# Patient Record
Sex: Male | Born: 1970 | Race: White | Hispanic: No | Smoking: Never smoker
Health system: Southern US, Community
[De-identification: ages and names within clinical notes are randomized; demographics above are authoritative.]

## PROBLEM LIST (undated history)

## (undated) DIAGNOSIS — I10 Essential (primary) hypertension: Secondary | ICD-10-CM

## (undated) DIAGNOSIS — E119 Type 2 diabetes mellitus without complications: Secondary | ICD-10-CM

## (undated) HISTORY — PX: BACK SURGERY: SHX140

## (undated) HISTORY — PX: HIP SURGERY: SHX245

---

## 1999-11-02 ENCOUNTER — Encounter: Payer: Self-pay | Admitting: Neurosurgery

## 1999-11-02 ENCOUNTER — Ambulatory Visit (HOSPITAL_COMMUNITY): Admission: RE | Admit: 1999-11-02 | Discharge: 1999-11-02 | Payer: Self-pay | Admitting: Neurosurgery

## 1999-11-12 ENCOUNTER — Encounter: Payer: Self-pay | Admitting: Neurosurgery

## 1999-11-12 ENCOUNTER — Ambulatory Visit (HOSPITAL_COMMUNITY): Admission: RE | Admit: 1999-11-12 | Discharge: 1999-11-12 | Payer: Self-pay | Admitting: Neurosurgery

## 1999-11-13 ENCOUNTER — Encounter: Payer: Self-pay | Admitting: Neurosurgery

## 1999-11-13 ENCOUNTER — Ambulatory Visit (HOSPITAL_COMMUNITY): Admission: RE | Admit: 1999-11-13 | Discharge: 1999-11-13 | Payer: Self-pay | Admitting: Neurosurgery

## 2000-12-28 ENCOUNTER — Encounter: Payer: Self-pay | Admitting: Orthopedic Surgery

## 2001-01-02 ENCOUNTER — Ambulatory Visit (HOSPITAL_COMMUNITY): Admission: RE | Admit: 2001-01-02 | Discharge: 2001-01-02 | Payer: Self-pay | Admitting: Orthopedic Surgery

## 2001-01-02 ENCOUNTER — Encounter: Payer: Self-pay | Admitting: Orthopedic Surgery

## 2003-01-31 ENCOUNTER — Ambulatory Visit (HOSPITAL_COMMUNITY): Admission: RE | Admit: 2003-01-31 | Discharge: 2003-01-31 | Payer: Self-pay | Admitting: Orthopedic Surgery

## 2004-01-03 ENCOUNTER — Emergency Department (HOSPITAL_COMMUNITY): Admission: EM | Admit: 2004-01-03 | Discharge: 2004-01-03 | Payer: Self-pay | Admitting: Emergency Medicine

## 2004-10-18 ENCOUNTER — Encounter: Admission: RE | Admit: 2004-10-18 | Discharge: 2004-10-18 | Payer: Self-pay | Admitting: Family Medicine

## 2004-12-06 ENCOUNTER — Emergency Department: Payer: Self-pay | Admitting: Unknown Physician Specialty

## 2005-01-11 ENCOUNTER — Ambulatory Visit (HOSPITAL_COMMUNITY): Admission: RE | Admit: 2005-01-11 | Discharge: 2005-01-12 | Payer: Self-pay | Admitting: Neurosurgery

## 2005-02-27 ENCOUNTER — Emergency Department: Payer: Self-pay | Admitting: Emergency Medicine

## 2005-04-29 ENCOUNTER — Encounter: Admission: RE | Admit: 2005-04-29 | Discharge: 2005-04-29 | Payer: Self-pay | Admitting: Neurosurgery

## 2005-05-08 ENCOUNTER — Emergency Department: Payer: Self-pay | Admitting: Emergency Medicine

## 2006-12-11 IMAGING — CR DG SHOULDER 3+V*L*
1 series · 3 of 3 positions shown · non-contrast
Comparison: none

REASON FOR EXAM: Fall
COMMENTS:  LMP: (Male)

PROCEDURE:     DXR - DXR SHOULDER LEFT COMPLETE  - February 27, 2005 [DATE]
RESULT:          No acute soft tissue or bony abnormality is identified.
There is no evidence of fracture or dislocation.

[Series 1: view not recorded · 0.17mm/px · 3 of 3 slices shown]
[im 1/3]
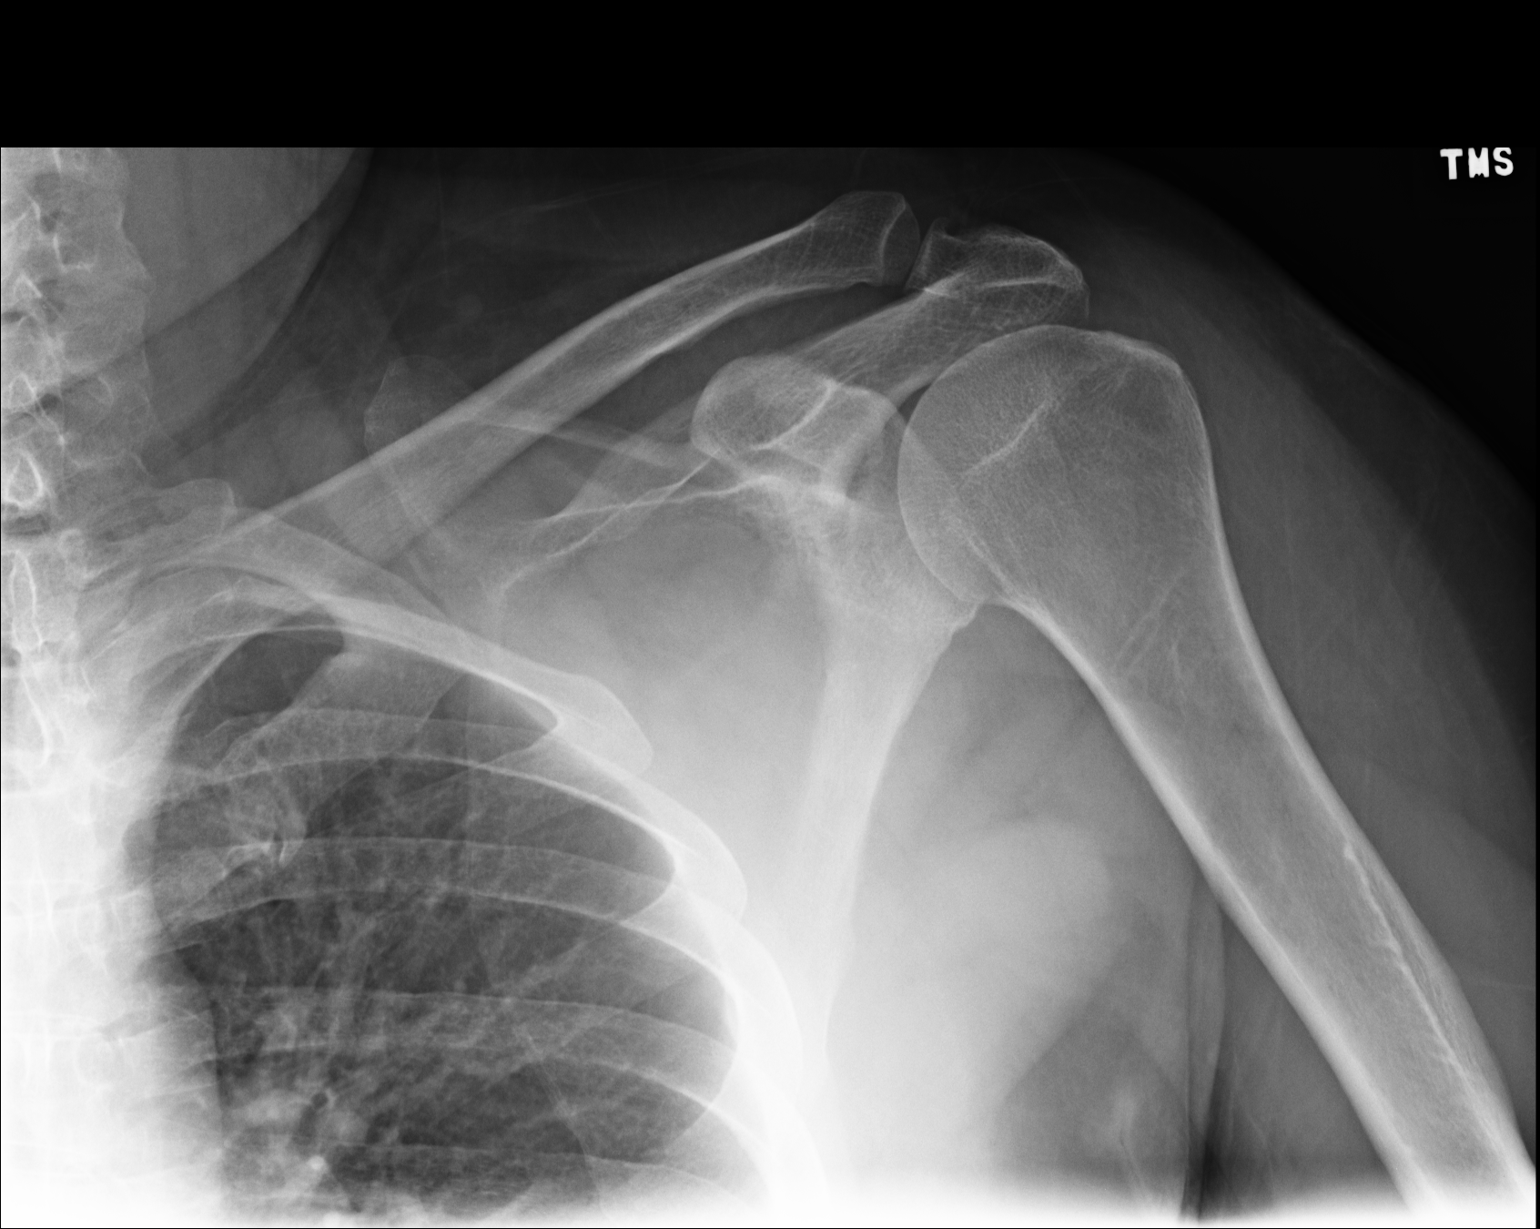
[im 2/3]
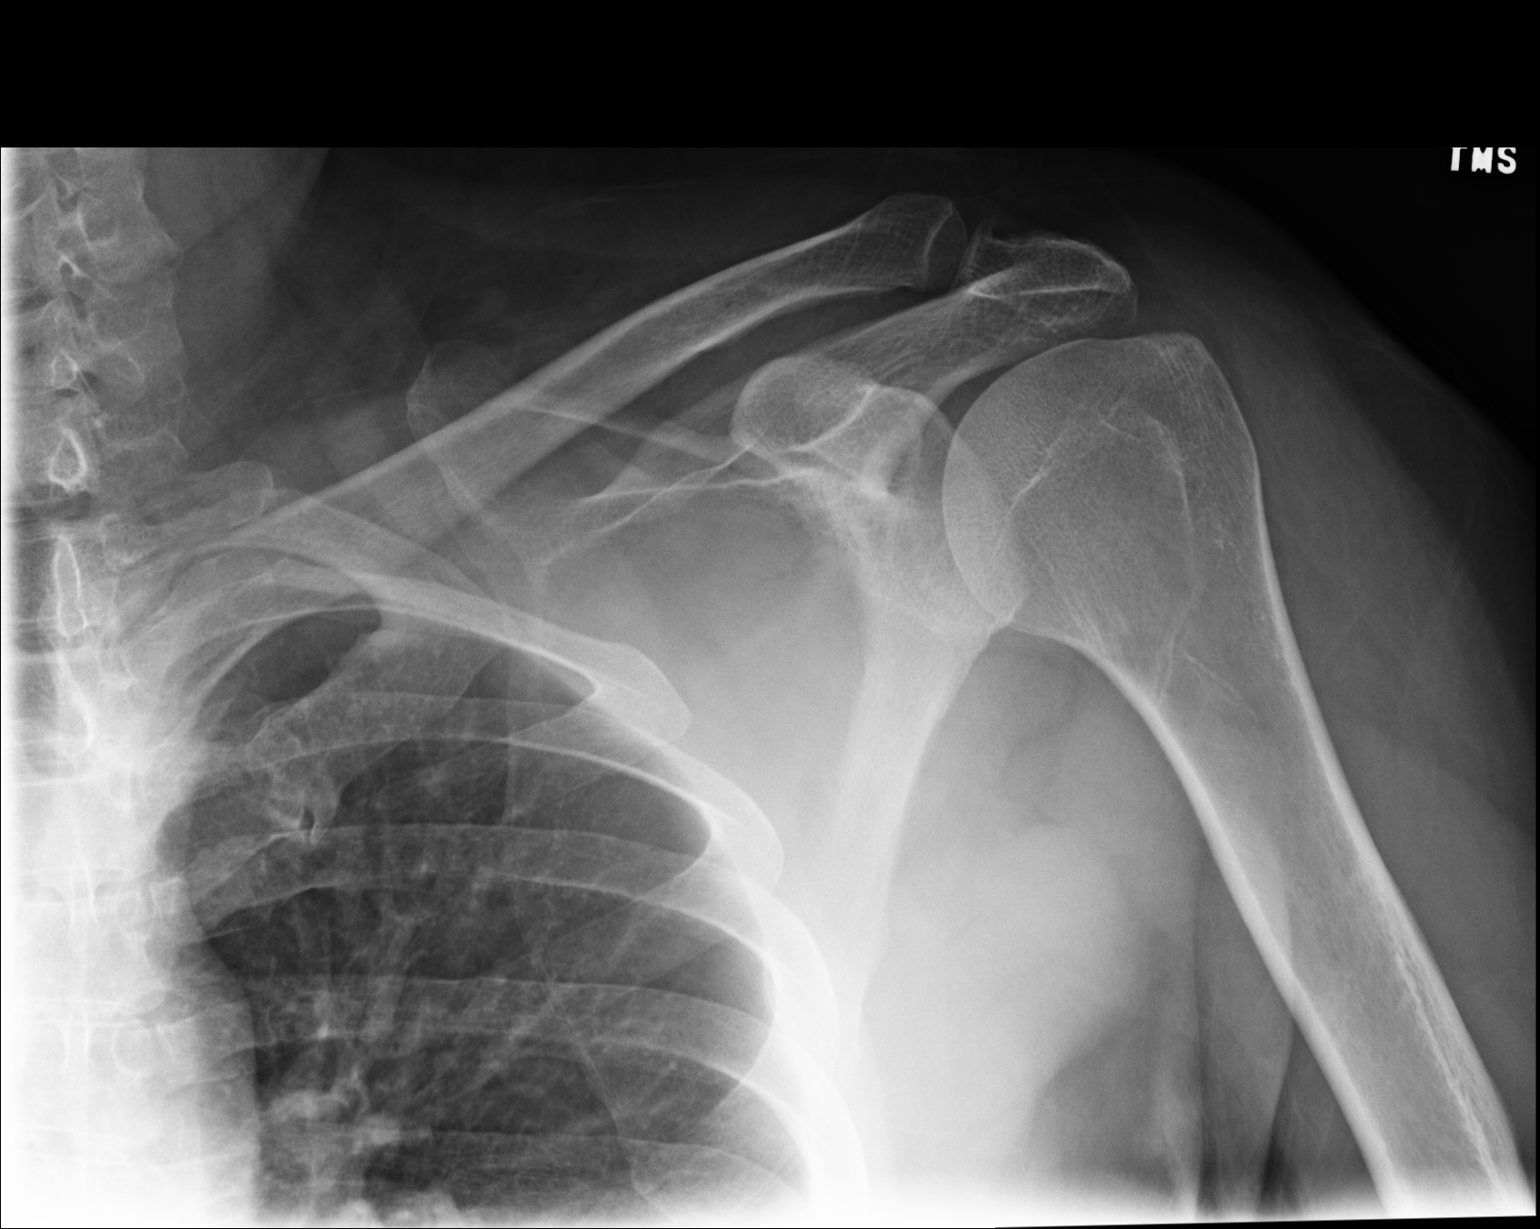
[im 3/3]
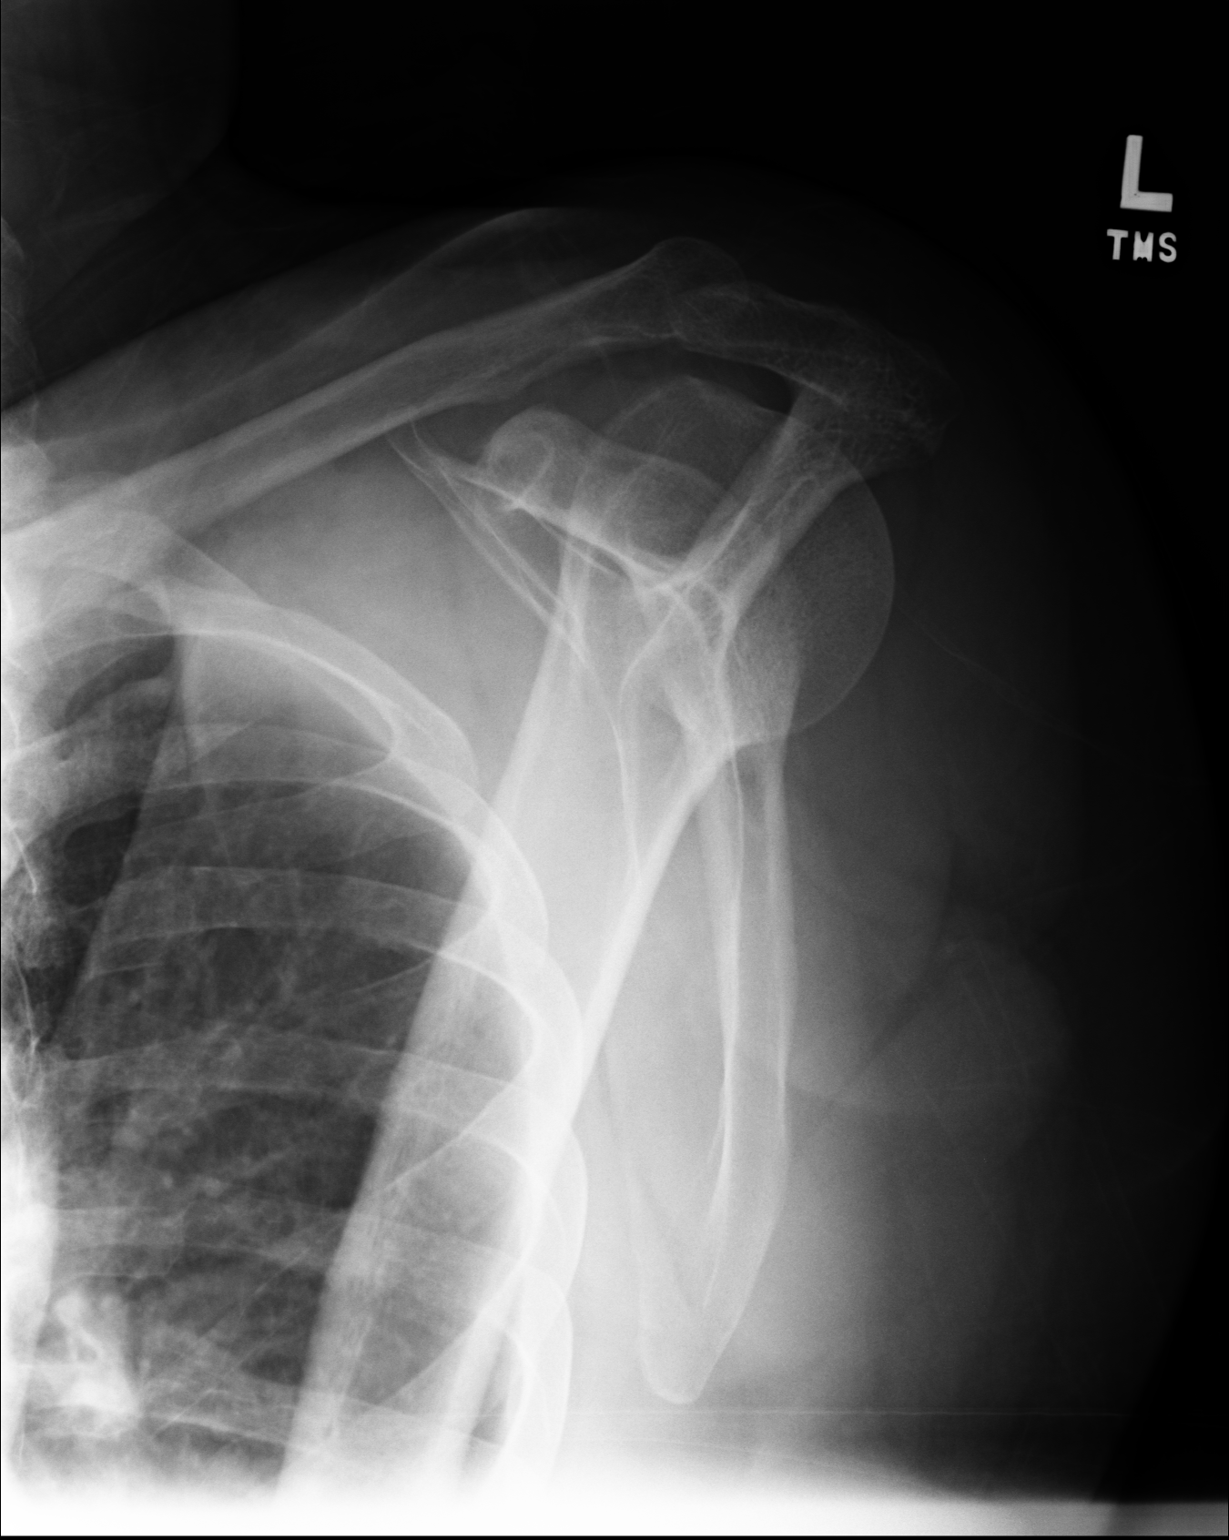

[3 of 3 positions shown; findings below may reference images not displayed]

IMPRESSION: No acute abnormality is identified.

## 2007-12-28 ENCOUNTER — Emergency Department: Payer: Self-pay | Admitting: Emergency Medicine

## 2010-09-26 ENCOUNTER — Emergency Department: Payer: Self-pay | Admitting: Emergency Medicine

## 2010-10-10 ENCOUNTER — Emergency Department: Payer: Self-pay | Admitting: Emergency Medicine

## 2011-01-04 ENCOUNTER — Emergency Department: Payer: Self-pay | Admitting: Emergency Medicine

## 2013-05-04 ENCOUNTER — Other Ambulatory Visit: Payer: Self-pay | Admitting: Neurosurgery

## 2013-05-04 DIAGNOSIS — M48062 Spinal stenosis, lumbar region with neurogenic claudication: Secondary | ICD-10-CM

## 2013-05-14 ENCOUNTER — Ambulatory Visit
Admission: RE | Admit: 2013-05-14 | Discharge: 2013-05-14 | Disposition: A | Discharge status: Home / Self Care | Payer: BC Managed Care – PPO | Source: Ambulatory Visit | Attending: Neurosurgery | Admitting: Neurosurgery

## 2013-05-14 DIAGNOSIS — M48062 Spinal stenosis, lumbar region with neurogenic claudication: Secondary | ICD-10-CM

## 2013-05-14 MED ORDER — GADOBENATE DIMEGLUMINE 529 MG/ML IV SOLN
20.0000 mL | Freq: Once | INTRAVENOUS | Status: AC | PRN
  Administered 2013-05-14: 20 mL via INTRAVENOUS

## 2013-10-02 ENCOUNTER — Emergency Department: Payer: Self-pay | Admitting: Emergency Medicine

## 2013-10-02 LAB — BASIC METABOLIC PANEL
Anion Gap: 6 — ABNORMAL LOW (ref 7–16)
BUN: 19 mg/dL — ABNORMAL HIGH (ref 7–18)
Calcium, Total: 9.1 mg/dL (ref 8.5–10.1)
Chloride: 95 mmol/L — ABNORMAL LOW (ref 98–107)
Co2: 30 mmol/L (ref 21–32)
Creatinine: 1.16 mg/dL (ref 0.60–1.30)
EGFR (African American): >60
EGFR (Non-African Amer.): >60
Glucose: 292 mg/dL — ABNORMAL HIGH (ref 65–99)
Osmolality: 276 (ref 275–301)
Potassium: 4.5 mmol/L (ref 3.5–5.1)
Sodium: 131 mmol/L — ABNORMAL LOW (ref 136–145)

## 2013-10-02 LAB — COMPREHENSIVE METABOLIC PANEL
Albumin: 3.7 g/dL (ref 3.4–5.0)
Alkaline Phosphatase: 115 U/L
Anion Gap: 9 (ref 7–16)
BUN: 18 mg/dL (ref 7–18)
Bilirubin,Total: 0.3 mg/dL (ref 0.2–1.0)
Calcium, Total: 9.7 mg/dL (ref 8.5–10.1)
Chloride: 93 mmol/L — ABNORMAL LOW (ref 98–107)
Co2: 24 mmol/L (ref 21–32)
Creatinine: 1.1 mg/dL (ref 0.60–1.30)
EGFR (African American): >60
EGFR (Non-African Amer.): >60
Glucose: 362 mg/dL — ABNORMAL HIGH (ref 65–99)
Osmolality: 270 (ref 275–301)
Potassium: 4.2 mmol/L (ref 3.5–5.1)
SGOT(AST): 20 U/L (ref 15–37)
SGPT (ALT): 38 U/L
Sodium: 126 mmol/L — ABNORMAL LOW (ref 136–145)
Total Protein: 8.8 g/dL — ABNORMAL HIGH (ref 6.4–8.2)

## 2013-10-02 LAB — CBC
HCT: 44.6 % (ref 40.0–52.0)
HCT: 41.1 % (ref 40.0–52.0)
HGB: 14.4 g/dL (ref 13.0–18.0)
HGB: 13.2 g/dL (ref 13.0–18.0)
MCH: 27 pg (ref 26.0–34.0)
MCH: 26.9 pg (ref 26.0–34.0)
MCHC: 32.4 g/dL (ref 32.0–36.0)
MCHC: 32.1 g/dL (ref 32.0–36.0)
MCV: 84 fL (ref 80–100)
MCV: 84 fL (ref 80–100)
Platelet: 456 10*3/uL — ABNORMAL HIGH (ref 150–440)
Platelet: 417 10*3/uL (ref 150–440)
RBC: 5.33 10*6/uL (ref 4.40–5.90)
RBC: 4.9 10*6/uL (ref 4.40–5.90)
RDW: 14.2 % (ref 11.5–14.5)
RDW: 14.5 % (ref 11.5–14.5)
WBC: 20.9 10*3/uL — ABNORMAL HIGH (ref 3.8–10.6)
WBC: 20 10*3/uL — ABNORMAL HIGH (ref 3.8–10.6)

## 2013-10-02 LAB — URINALYSIS, COMPLETE
Bilirubin,UR: NEGATIVE
Blood: NEGATIVE
Glucose,UR: >=500 mg/dL (ref 0–75)
Hyaline Cast: 1
Ketone: NEGATIVE
Nitrite: POSITIVE
Ph: 5 (ref 4.5–8.0)
Protein: 30
RBC,UR: 9 /HPF (ref 0–5)
Specific Gravity: 1.025 (ref 1.003–1.030)
Squamous Epithelial: 8
WBC UR: 38 /HPF (ref 0–5)

## 2014-09-19 ENCOUNTER — Emergency Department
Admission: EM | Admit: 2014-09-19 | Discharge: 2014-09-19 | Disposition: A | Discharge status: Home / Self Care | Payer: Self-pay | Attending: Emergency Medicine | Admitting: Emergency Medicine

## 2014-09-19 DIAGNOSIS — Y9389 Activity, other specified: Secondary | ICD-10-CM | POA: Insufficient documentation

## 2014-09-19 DIAGNOSIS — S29012A Strain of muscle and tendon of back wall of thorax, initial encounter: Secondary | ICD-10-CM | POA: Insufficient documentation

## 2014-09-19 DIAGNOSIS — Y9289 Other specified places as the place of occurrence of the external cause: Secondary | ICD-10-CM | POA: Insufficient documentation

## 2014-09-19 DIAGNOSIS — X58XXXA Exposure to other specified factors, initial encounter: Secondary | ICD-10-CM | POA: Insufficient documentation

## 2014-09-19 DIAGNOSIS — S29019A Strain of muscle and tendon of unspecified wall of thorax, initial encounter: Secondary | ICD-10-CM

## 2014-09-19 DIAGNOSIS — S3992XA Unspecified injury of lower back, initial encounter: Secondary | ICD-10-CM | POA: Insufficient documentation

## 2014-09-19 DIAGNOSIS — Y998 Other external cause status: Secondary | ICD-10-CM | POA: Insufficient documentation

## 2014-09-19 MED ORDER — BACLOFEN 10 MG PO TABS: 10.0000 mg | ORAL_TABLET | Freq: Three times a day (TID) | ORAL | Status: AC

## 2014-09-19 MED ORDER — PROMETHAZINE HCL 25 MG/ML IJ SOLN
12.5000 mg | Freq: Once | INTRAMUSCULAR | Status: AC
  Administered 2014-09-19: 12.5 mg via INTRAMUSCULAR
  Filled 2014-09-19: qty 1

## 2014-09-19 MED ORDER — OXYCODONE HCL 5 MG PO TABS: 5.0000 mg | ORAL_TABLET | Freq: Three times a day (TID) | ORAL | Status: AC | PRN

## 2014-09-19 MED ORDER — MEPERIDINE HCL 50 MG/ML IJ SOLN
50.0000 mg | Freq: Once | INTRAMUSCULAR | Status: AC
  Administered 2014-09-19: 50 mg via INTRAMUSCULAR
  Filled 2014-09-19: qty 1

## 2014-09-19 NOTE — ED Provider Notes (Signed)
[facility] Emergency Department Provider Note  ____________________________________________  Time seen: Approximately 8:29 PM  I have reviewed the triage vital signs and the nursing notes.   HISTORY  Chief Complaint Back Pain   HPI Craig Mercer is a 44 y.o. male who presents for evaluation of mid back pain. Patient states that he twists his back on the truck. Past medical history significant for multiple back surgeries with rods in place.   No past medical history on file.  There are no active problems to display for this patient.   No past surgical history on file.  Current Outpatient Rx  Name  Route  Sig  Dispense  Refill  . baclofen (LIORESAL) 10 MG tablet   Oral   Take 1 tablet (10 mg total) by mouth 3 (three) times daily.   30 tablet   1   . oxyCODONE (ROXICODONE) 5 MG immediate release tablet   Oral   Take 1 tablet (5 mg total) by mouth every 8 (eight) hours as needed.   10 tablet   0     Allergies Azithromycin; Celebrex; and Benadryl  No family history on file.  Social History Social History  Substance Use Topics  . Smoking status: Not on file  . Smokeless tobacco: Not on file  . Alcohol Use: Not on file    Review of Systems Constitutional: No fever/chills Eyes: No visual changes. ENT: No sore throat. Cardiovascular: Denies chest pain. Respiratory: Denies shortness of breath. Gastrointestinal: No abdominal pain.  No nausea, no vomiting.  No diarrhea.  No constipation. Genitourinary: Negative for dysuria. Musculoskeletal: Positive for mid lower back pain. Skin: Negative for rash. Neurological: Negative for headaches, focal weakness or numbness.  10-point ROS otherwise negative.  ____________________________________________   PHYSICAL EXAM:  VITAL SIGNS: ED Triage Vitals  Enc Vitals Group     BP 09/19/14 2009 171/98 mmHg     Pulse Rate 09/19/14 2009 98     Resp 09/19/14 2009 20     Temp 09/19/14 2009 97.8  F (36.6 C)     Temp Source 09/19/14 2009 Oral     SpO2 09/19/14 2009 97 %     Weight 09/19/14 2009 260 lb (117.935 kg)     Height 09/19/14 2009  (1.727 m)     Head Cir --      Peak Flow --      Pain Score 09/19/14 2010 10     Pain Loc --      Pain Edu? --      Excl. in GC? --     Constitutional: Alert and oriented. Well appearing and in no acute distress. Eyes: Conjunctivae are normal. PERRL. EOMI. Head: Atraumatic. Nose: No congestion/rhinnorhea. Mouth/Throat: Mucous membranes are moist.  Oropharynx non-erythematous. Neck: No stridor.   Cardiovascular: Normal rate, regular rhythm. Grossly normal heart sounds.  Good peripheral circulation. Respiratory: Normal respiratory effort.  No retractions. Lungs CTAB. Gastrointestinal: Soft and nontender. No distention. No abdominal bruits. No CVA tenderness. Musculoskeletal: No lower extremity tenderness nor edema.  No joint effusions. Positive lumbar and thoracic myofascial tenderness. Unable to lay down flat. Neurologic:  Normal speech and language. No gross focal neurologic deficits are appreciated. No gait instability. Skin:  Skin is warm, dry and intact. No rash noted. Psychiatric: Mood and affect are normal. Speech and behavior are normal.  ____________________________________________   LABS (all labs ordered are listed, but only abnormal results are displayed)  Labs Reviewed - No data to display ____________________________________________  EKG  Deferred ____________________________________________  RADIOLOGY  Deferred ____________________________________________   PROCEDURES  Procedure(s) performed: None  Critical Care performed: No  ____________________________________________   INITIAL IMPRESSION / ASSESSMENT AND PLAN / ED COURSE  Pertinent labs & imaging results that were available during my care of the patient were reviewed by me and considered in my medical decision making (see chart for  details).  Acute thoracic lumbar myofascial strain. Patient continues Motrin 800 mg over-the-counter from previous prescription. Rx given for baclofen 10 mg 3 times a day and oxycodone 5 mg. Patient follow-up with his PCP as soon as possible. ____________________________________________   FINAL CLINICAL IMPRESSION(S) / ED DIAGNOSES  Final diagnoses:  Thoracic myofascial strain, initial encounter      EVANGELOS PAULINO, PA-C 09/19/14 2037  Phineas Semen, MD 09/19/14 2150

## 2014-09-19 NOTE — ED Notes (Signed)
Pt to ED c/o lower back pain.

## 2014-09-19 NOTE — Discharge Instructions (Signed)

## 2014-10-26 ENCOUNTER — Emergency Department
Admission: EM | Admit: 2014-10-26 | Discharge: 2014-10-26 | Disposition: A | Discharge status: Home / Self Care | Payer: Self-pay | Attending: Emergency Medicine | Admitting: Emergency Medicine

## 2014-10-26 DIAGNOSIS — I10 Essential (primary) hypertension: Secondary | ICD-10-CM | POA: Insufficient documentation

## 2014-10-26 DIAGNOSIS — S39012A Strain of muscle, fascia and tendon of lower back, initial encounter: Secondary | ICD-10-CM | POA: Insufficient documentation

## 2014-10-26 DIAGNOSIS — Y9389 Activity, other specified: Secondary | ICD-10-CM | POA: Insufficient documentation

## 2014-10-26 DIAGNOSIS — Y998 Other external cause status: Secondary | ICD-10-CM | POA: Insufficient documentation

## 2014-10-26 DIAGNOSIS — Y92410 Unspecified street and highway as the place of occurrence of the external cause: Secondary | ICD-10-CM | POA: Insufficient documentation

## 2014-10-26 DIAGNOSIS — Z79899 Other long term (current) drug therapy: Secondary | ICD-10-CM | POA: Insufficient documentation

## 2014-10-26 HISTORY — DX: Essential (primary) hypertension: I10

## 2014-10-26 MED ORDER — KETOROLAC TROMETHAMINE 30 MG/ML IJ SOLN
60.0000 mg | Freq: Once | INTRAMUSCULAR | Status: AC
  Administered 2014-10-26: 60 mg via INTRAMUSCULAR
  Filled 2014-10-26: qty 2

## 2014-10-26 MED ORDER — OXYCODONE-ACETAMINOPHEN 5-325 MG PO TABS
1.0000 | ORAL_TABLET | Freq: Once | ORAL | Status: AC
  Administered 2014-10-26: 1 via ORAL
  Filled 2014-10-26: qty 1

## 2014-10-26 MED ORDER — DIAZEPAM 2 MG PO TABS
2.0000 mg | ORAL_TABLET | Freq: Once | ORAL | Status: AC
  Administered 2014-10-26: 2 mg via ORAL
  Filled 2014-10-26: qty 1

## 2014-10-26 MED ORDER — OXYCODONE-ACETAMINOPHEN 5-325 MG PO TABS: 1.0000 | ORAL_TABLET | ORAL | Status: DC | PRN

## 2014-10-26 MED ORDER — DIAZEPAM 2 MG PO TABS: 2.0000 mg | ORAL_TABLET | Freq: Three times a day (TID) | ORAL | Status: AC | PRN
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
## 2015-03-28 DIAGNOSIS — Z79899 Other long term (current) drug therapy: Secondary | ICD-10-CM | POA: Insufficient documentation

## 2015-03-28 DIAGNOSIS — X038XXA Other exposure to controlled fire, not in building or structure, initial encounter: Secondary | ICD-10-CM | POA: Insufficient documentation

## 2015-03-28 DIAGNOSIS — T24232A Burn of second degree of left lower leg, initial encounter: Secondary | ICD-10-CM | POA: Insufficient documentation

## 2015-03-28 DIAGNOSIS — Y9289 Other specified places as the place of occurrence of the external cause: Secondary | ICD-10-CM | POA: Insufficient documentation

## 2015-03-28 DIAGNOSIS — Y9389 Activity, other specified: Secondary | ICD-10-CM | POA: Insufficient documentation

## 2015-03-28 DIAGNOSIS — E119 Type 2 diabetes mellitus without complications: Secondary | ICD-10-CM | POA: Insufficient documentation

## 2015-03-28 DIAGNOSIS — Y998 Other external cause status: Secondary | ICD-10-CM | POA: Insufficient documentation

## 2015-03-28 DIAGNOSIS — I10 Essential (primary) hypertension: Secondary | ICD-10-CM | POA: Insufficient documentation

## 2015-03-28 DIAGNOSIS — Z23 Encounter for immunization: Secondary | ICD-10-CM | POA: Insufficient documentation

## 2015-03-29 ENCOUNTER — Emergency Department
Admission: EM | Admit: 2015-03-29 | Discharge: 2015-03-29 | Disposition: A | Discharge status: Home / Self Care | Payer: No Typology Code available for payment source | Attending: Emergency Medicine | Admitting: Emergency Medicine

## 2015-03-29 ENCOUNTER — Encounter: Payer: Self-pay | Admitting: Emergency Medicine

## 2015-03-29 DIAGNOSIS — T24202A Burn of second degree of unspecified site of left lower limb, except ankle and foot, initial encounter: Secondary | ICD-10-CM

## 2015-03-29 HISTORY — DX: Type 2 diabetes mellitus without complications: E11.9

## 2015-03-29 LAB — GLUCOSE, CAPILLARY: Glucose-Capillary: 198 mg/dL — ABNORMAL HIGH (ref 65–99)

## 2015-03-29 MED ORDER — OXYCODONE-ACETAMINOPHEN 5-325 MG PO TABS
1.0000 | ORAL_TABLET | Freq: Once | ORAL | Status: AC
  Administered 2015-03-29: 1 via ORAL

## 2015-03-29 MED ORDER — TETANUS-DIPHTH-ACELL PERTUSSIS 5-2.5-18.5 LF-MCG/0.5 IM SUSP
0.5000 mL | Freq: Once | INTRAMUSCULAR | Status: AC
  Administered 2015-03-29: 0.5 mL via INTRAMUSCULAR
  Filled 2015-03-29: qty 0.5

## 2015-03-29 MED ORDER — OXYCODONE-ACETAMINOPHEN 5-325 MG PO TABS
1.0000 | ORAL_TABLET | Freq: Once | ORAL | Status: AC
  Administered 2015-03-29: 1 via ORAL
  Filled 2015-03-29: qty 1

## 2015-03-29 MED ORDER — SILVER SULFADIAZINE 1 % EX CREA
TOPICAL_CREAM | CUTANEOUS | Status: AC
Start: 2015-03-29 — End: 2016-03-28

## 2015-03-29 MED ORDER — OXYCODONE-ACETAMINOPHEN 5-325 MG PO TABS: 1.0000 | ORAL_TABLET | ORAL | Status: AC | PRN

## 2015-03-29 MED ORDER — OXYCODONE-ACETAMINOPHEN 5-325 MG PO TABS
ORAL_TABLET | ORAL | Status: AC
  Filled 2015-03-29: qty 1
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
## 2015-03-29 NOTE — Discharge Instructions (Signed)
Burn Care °Your skin is a natural barrier to infection. It is the largest organ of your body. Burns damage this natural protection. To help prevent infection, it is very important to follow your caregiver's instructions in the care of your burn. °Burns are classified as: °· First degree. There is only redness of the skin (erythema). No scarring is expected. °· Second degree. There is blistering of the skin. Scarring may occur with deeper burns. °· Third degree. All layers of the skin are injured, and scarring is expected. °HOME CARE INSTRUCTIONS  °· Wash your hands well before changing your bandage. °· Change your bandage as often as directed by your caregiver. °· Remove the old bandage. If the bandage sticks, you may soak it off with cool, clean water. °· Cleanse the burn thoroughly but gently with mild soap and water. °· Pat the area dry with a clean, dry cloth. °· Apply a thin layer of antibacterial cream to the burn. °· Apply a clean bandage as instructed by your caregiver. °· Keep the bandage as clean and dry as possible. °· Elevate the affected area for the first 24 hours, then as instructed by your caregiver. °· Only take over-the-counter or prescription medicines for pain, discomfort, or fever as directed by your caregiver. °SEEK IMMEDIATE MEDICAL CARE IF:  °· You develop excessive pain. °· You develop redness, tenderness, swelling, or red streaks near the burn. °· The burned area develops yellowish-white fluid (pus) or a bad smell. °· You have a fever. °MAKE SURE YOU:  °· Understand these instructions. °· Will watch your condition. °· Will get help right away if you are not doing well or get worse. °  °This information is not intended to replace advice given to you by your health care provider. Make sure you discuss any questions you have with your health care provider. °  °Document Released: 01/18/2005 Document Revised: 04/12/2011 Document Reviewed: 06/10/2010 °[employer]vier Interactive Patient Education ©2016  [employer] ° °Second-Degree Burn °A second-degree burn affects the 2 outer layers of skin. The outer layer (epidermis) and the layer underneath it (dermis) are both burned. Another name for this type of burn is a partial thickness burn. A second-degree burn may be called minor or major. This depends on the size of the burn. It also depends on what parts of the skin are burned. Minor burns may be treated with first aid. Major burns are a medical emergency. °A second-degree burn is worse than a first-degree burn, but not as bad as a third-degree burn. A first-degree burn affects only the epidermis. A third-degree burn goes through all the layers of skin. A second-degree burn usually heals in 3 to 4 weeks. A minor second-degree burn usually does not leave a scar. Deeper second-degree burns may lead to scarring of the skin or contractures over joints. Contractures are scars that form over joints and may lead to reduced mobility at those joints. °CAUSES °· Heat (thermal) injury. This happens when skin comes in contact with something very hot. It could be a flame, a hot object, hot liquid, or steam. Most second-degree burns are thermal injuries. °· Radiation. Sunlight is one type of radiation that can burn the skin. Another type of radiation is used to heat food. Radiation is also used to treat some diseases, such as cancer. All types of radiation can burn the skin. Sunlight usually causes a first-degree burn. Radiation used for heating food or treating a disease can cause a second-degree burn. °· Electricity. Electrical burns can cause more   damage under the skin than on the surface. They should always be treated as major burns. °· Chemicals. Many chemicals can burn the skin. The burn should be flushed with cool water and checked by an emergency caregiver. °SYMPTOMS °Symptoms of second-degree burns include: °· Severe pain. °· Extreme tenderness. °· Deep redness. °· Blistered skin. °· Skin that has changed color. It  might look blotchy, wet, or shiny. °· Swelling. °TREATMENT °Some second-degree burns may need to be treated in a hospital. These include major burns, electrical burns, and chemical burns. Many other second-degree burns can be treated with regular first aid, such as: °· Cooling the burn. Use cool, germ-free (sterile) salt water. Place the burned area of skin into a tub of water, or cover the burned area with clean, wet towels. °· Taking pain medicine. °· Removing the dead skin from broken blisters. A trained caregiver may do this. Do not pop blisters. °· Gently washing your skin with mild soap. °· Covering the burned area with a cream. Silver sulfadiazine is a cream for burns. An antibiotic cream, such as bacitracin, may also be used to fight infection. Do not use other ointments or creams unless your caregiver says it is okay. °· Protecting the burn with a sterile, non-sticky bandage. °· Bandaging fingers and toes separately. This keeps them from sticking together. °· Taking an antibiotic. This can help prevent infection. °· Getting a tetanus shot. °HOME CARE INSTRUCTIONS °Medication °· Take any medicine prescribed by your caregiver. Follow the directions carefully. °· Ask your caregiver if you can take over-the-counter medicine to relieve pain and swelling. Do not give aspirin to children. °· Make sure your caregiver knows about all other medicines you take. This includes over-the-counter medicines. °Burn care °· You will need to change the bandage on your burn. You may need to do this 2 or 3 times each day. °¨ Gently clean the burned area. °¨ Put ointment on it. °¨ Cover the burn with a sterile bandage. °· For some deeper burns or burns that cover a large area, compression garments may be prescribed. These garments can help minimize scarring and protect your mobility. °· Do not put butter or oil on your skin. Use only the cream prescribed by your caregiver. °· Do not put ice on your burn. °· Do not break blisters  on your skin. °· Keep the bandaged area dry. You might need to take a sponge bath for awhile. Ask your caregiver when you can take a shower or a tub bath again. °· Do not scratch an itchy burn. Your caregiver may give you medicine to relieve very bad itching. °· Infection is a big danger after a second-degree burn. Tell your caregiver right away if you have signs of infection, such as: °¨ Redness or changing color in the burned area. °¨ Fluid leaking from the burn. °¨ Swelling in the burn area. °¨ A bad smell coming from the wound. °Follow-up °· Keep all follow-up appointments. This is important. This is how your caregiver can tell if your treatment is working. °· Protect your burn from sunlight. Use sunscreen whenever you go outside. Burned areas may be sensitive to the sun for up to 1 year. Exposure to the sun may also cause permanent darkening of scars. °SEEK MEDICAL CARE IF: °· You have any questions about medicines. °· You have any questions about your treatment. °· You wonder if it is okay to do a particular activity. °· You develop a fever of more than 100.5° F (38.1° C). °SEEK IMMEDIATE MEDICAL CARE IF: °·   You think your burn might be infected. It may change color, become red, leak fluid, swell, or smell bad. °· You develop a fever of more than 102° F (38.9° C). °  °This information is not intended to replace advice given to you by your health care provider. Make sure you discuss any questions you have with your health care provider. °  °Document Released: 06/22/2010 Document Revised: 04/12/2011 Document Reviewed: 06/22/2010 °Elsevier Interactive Patient Education ©2016 [employer] ° °

## 2015-05-26 ENCOUNTER — Ambulatory Visit
Admission: RE | Admit: 2015-05-26 | Discharge: 2015-05-26 | Disposition: A | Discharge status: Home / Self Care | Payer: Disability Insurance | Source: Ambulatory Visit | Attending: General Practice | Admitting: General Practice

## 2015-05-26 ENCOUNTER — Other Ambulatory Visit: Payer: Self-pay | Admitting: General Practice

## 2015-05-26 DIAGNOSIS — M25552 Pain in left hip: Secondary | ICD-10-CM | POA: Diagnosis present

## 2015-05-26 DIAGNOSIS — M5126 Other intervertebral disc displacement, lumbar region: Secondary | ICD-10-CM

## 2015-05-26 DIAGNOSIS — M25551 Pain in right hip: Secondary | ICD-10-CM

## 2015-05-26 DIAGNOSIS — M160 Bilateral primary osteoarthritis of hip: Secondary | ICD-10-CM | POA: Insufficient documentation

## 2015-05-26 DIAGNOSIS — M5136 Other intervertebral disc degeneration, lumbar region: Secondary | ICD-10-CM | POA: Diagnosis not present

## 2015-05-26 DIAGNOSIS — M545 Low back pain: Secondary | ICD-10-CM | POA: Diagnosis present

## 2015-05-26 DIAGNOSIS — Z9889 Other specified postprocedural states: Secondary | ICD-10-CM | POA: Insufficient documentation

## 2015-07-16 IMAGING — CT CT STONE STUDY
2 of 4 series · 17 of 46 positions shown, 19 images · non-contrast
Comparison: CT of the abdomen and pelvis September 29, 2010

CLINICAL DATA: Left flank pain.

EXAM:
CT ABDOMEN AND PELVIS WITHOUT CONTRAST
TECHNIQUE: Multidetector CT imaging of the abdomen and pelvis was performed
following the standard protocol without IV contrast.

[Series 2: stone standard full · axial · 0.85mm/px · z∈[-970,-524]mm · 14 of 97 slices shown, 16 images]
[im 4/97  soft-tissue]
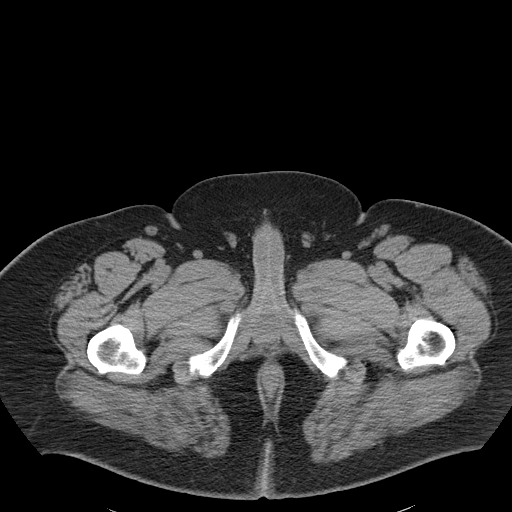
[im 4/97  bone]
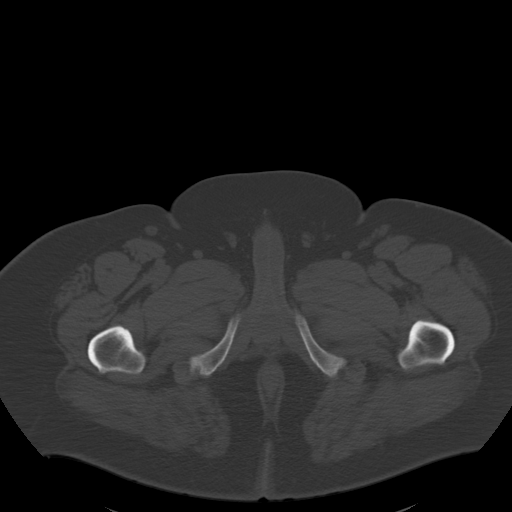
[im 12/97  soft-tissue]
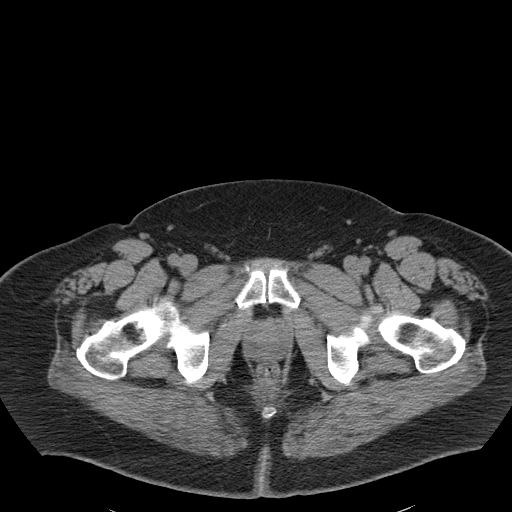
[im 19/97  soft-tissue]
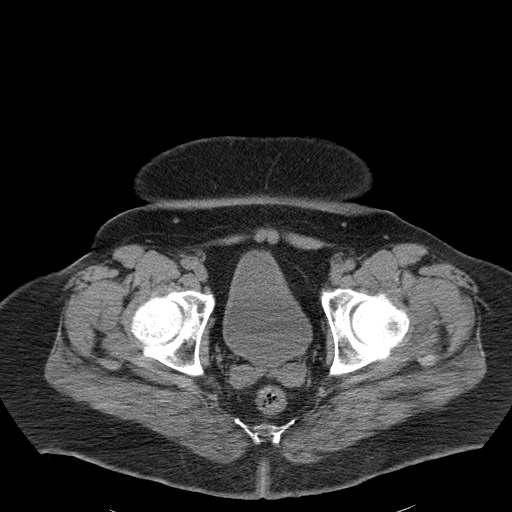
[im 26/97  soft-tissue]
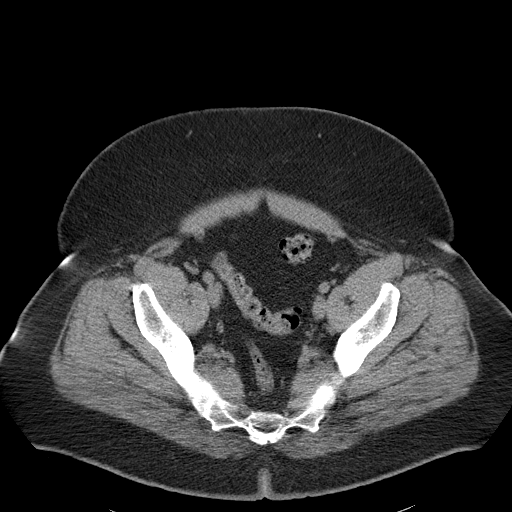
[im 34/97  soft-tissue]
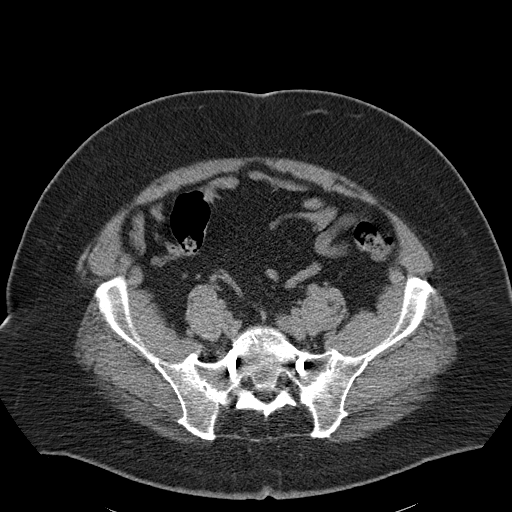
[im 37/97  soft-tissue]
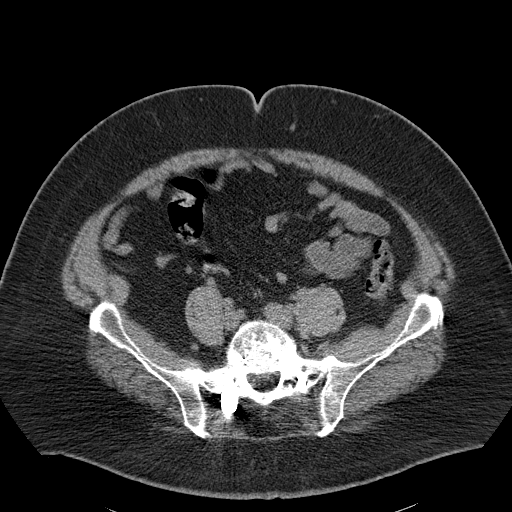
[im 45/97  soft-tissue]
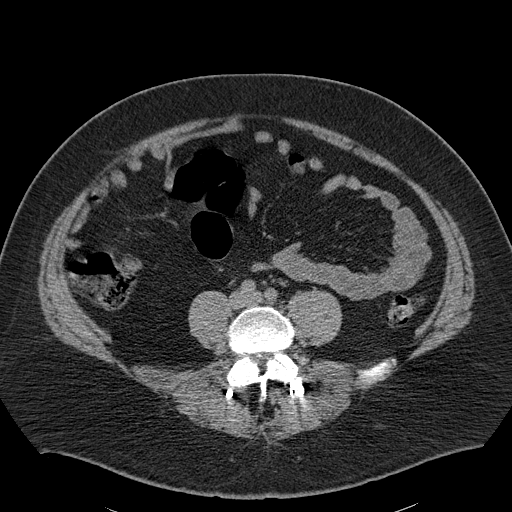
[im 52/97  soft-tissue]
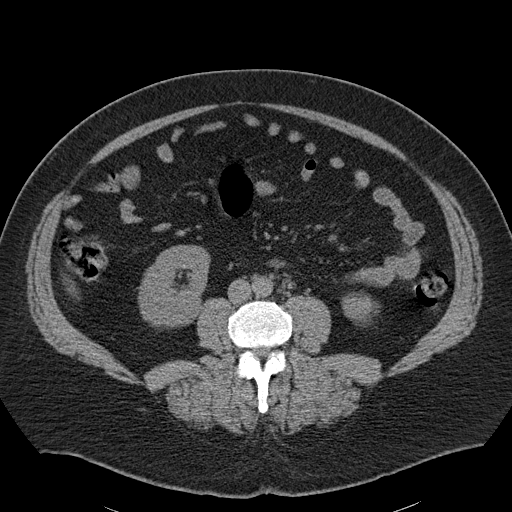
[im 60/97  soft-tissue]
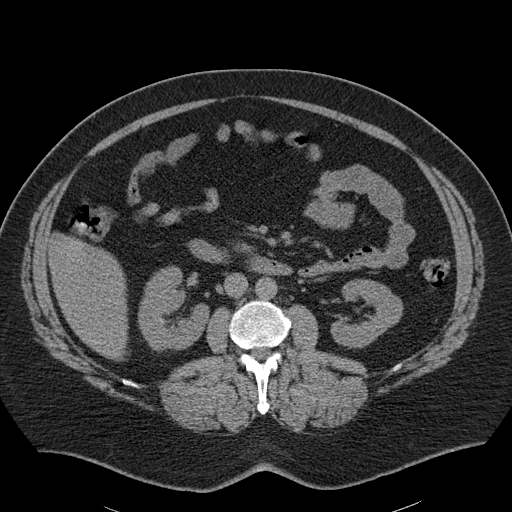
[im 60/97  bone]
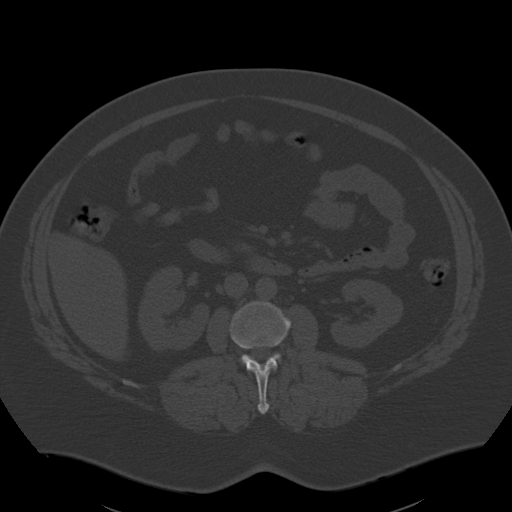
[im 63/97  soft-tissue]
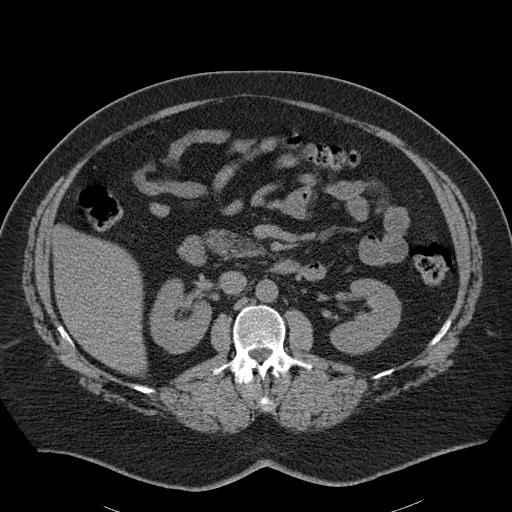
[im 71/97  soft-tissue]
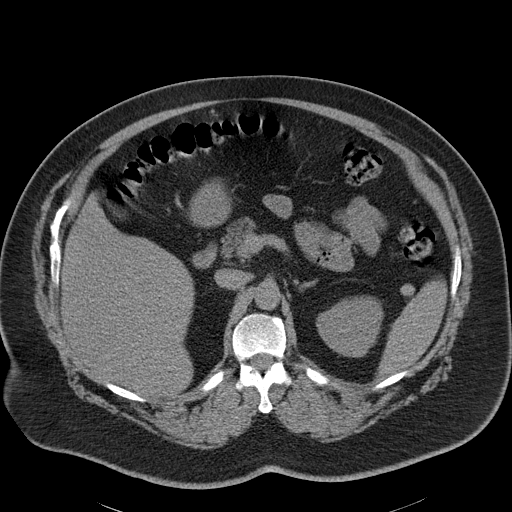
[im 78/97  soft-tissue]
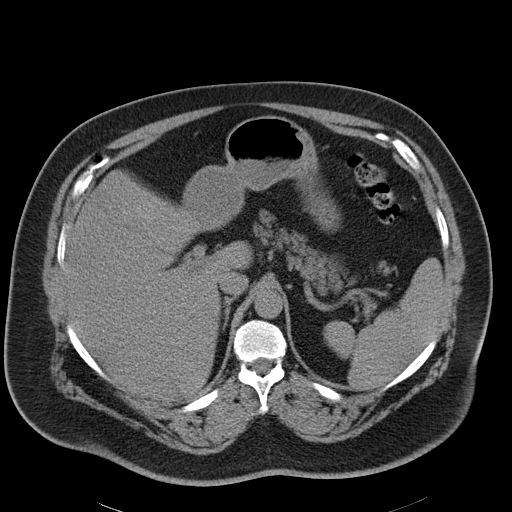
[im 85/97  soft-tissue]
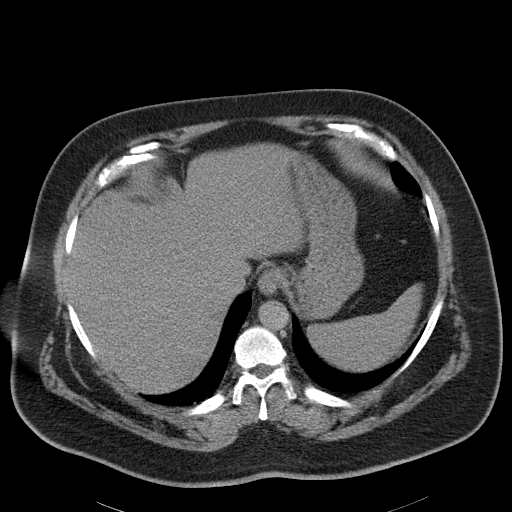
[im 93/97  soft-tissue]
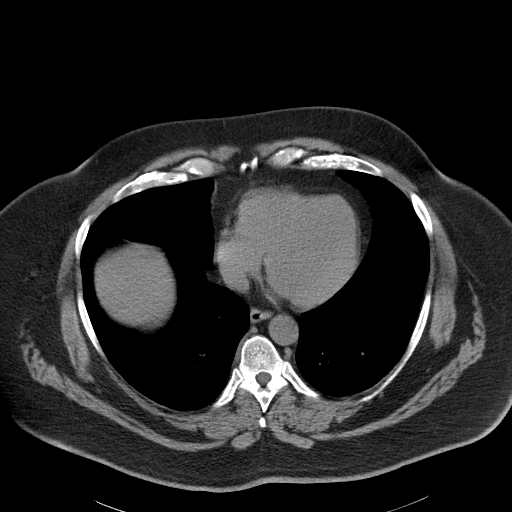

[Series 5: cor stone standard full · coronal · 0.93mm/px · 3 of 176 slices shown]
[im 59/176  soft-tissue]
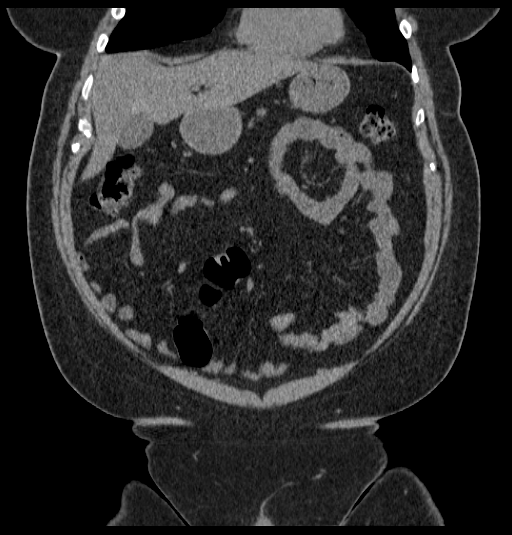
[im 78/176  soft-tissue]
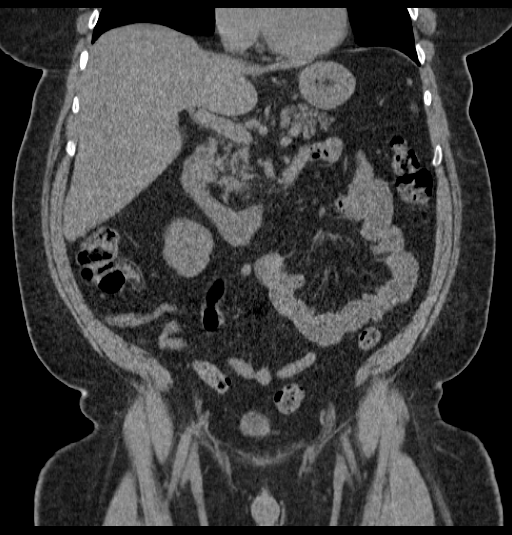
[im 98/176  soft-tissue]
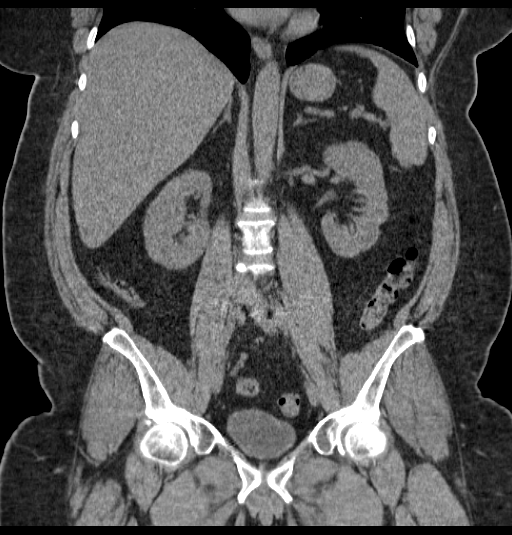

[17 of 46 positions shown; findings below may reference images not displayed]

FINDINGS: LUNG BASES: Included view of the lung bases are clear. The
visualized heart and pericardium are unremarkable.

KIDNEYS/BLADDER: Kidneys are orthotopic, demonstrating normal size
and morphology. Faint 2 mm interpolar density (axial 33/97). No
hydronephrosis; limited assessment for renal masses on this
nonenhanced examination. The unopacified ureters are normal in
course and caliber. Urinary bladder is partially distended and
unremarkable.

SOLID ORGANS: The spleen, gallbladder, pancreas and adrenal glands
are unremarkable for this non-contrast examination. Low-density
liver consistent with hepatic steatosis, liver is otherwise
unremarkable.

GASTROINTESTINAL TRACT: The stomach, small and large bowel are
normal in course and caliber without inflammatory changes, the
sensitivity may be decreased by lack of enteric contrast. Normal
appendix.

PERITONEUM/RETROPERITONEUM: No intraperitoneal free fluid nor free
air. Aortoiliac vessels are normal in course and caliber. No
lymphadenopathy by CT size criteria. Internal reproductive organs
are unremarkable. Phleboliths and left pelvis.

SOFT TISSUES/ OSSEOUS STRUCTURES: Nonsuspicious. L5-S1 PLIF with
solid interbody fusion.
IMPRESSION: 2 mm faint density in left interpolar kidney may reflect
nephrolithiasis without obstructive uropathy nor acute
intra-abdominal/pelvic process.

  By: Blondinacka Samsonaite

## 2015-09-02 DEATH — deceased
